# Patient Record
Sex: Female | Born: 1950 | Race: White | Hispanic: No
Health system: Southern US, Community
[De-identification: ages and names within clinical notes are randomized; demographics above are authoritative.]

---

## 2005-08-30 ENCOUNTER — Encounter (INDEPENDENT_AMBULATORY_CARE_PROVIDER_SITE_OTHER): Payer: Self-pay | Admitting: Specialist

## 2005-08-30 ENCOUNTER — Ambulatory Visit (HOSPITAL_COMMUNITY): Admission: RE | Admit: 2005-08-30 | Discharge: 2005-08-30 | Payer: Self-pay | Admitting: General Surgery

## 2006-09-19 ENCOUNTER — Emergency Department (HOSPITAL_COMMUNITY): Admission: EM | Admit: 2006-09-19 | Discharge: 2006-09-19 | Payer: Self-pay | Admitting: Emergency Medicine

## 2008-01-29 ENCOUNTER — Ambulatory Visit (HOSPITAL_COMMUNITY): Admission: EM | Admit: 2008-01-29 | Discharge: 2008-01-29 | Payer: Self-pay | Admitting: Emergency Medicine

## 2008-01-29 ENCOUNTER — Encounter (INDEPENDENT_AMBULATORY_CARE_PROVIDER_SITE_OTHER): Payer: Self-pay | Admitting: Obstetrics and Gynecology

## 2008-02-09 ENCOUNTER — Ambulatory Visit: Admission: RE | Admit: 2008-02-09 | Discharge: 2008-02-09 | Payer: Self-pay | Admitting: Gynecologic Oncology

## 2008-02-23 ENCOUNTER — Encounter: Payer: Self-pay | Admitting: Gynecologic Oncology

## 2008-02-23 ENCOUNTER — Inpatient Hospital Stay (HOSPITAL_COMMUNITY): Admission: RE | Admit: 2008-02-23 | Discharge: 2008-02-26 | Payer: Self-pay | Admitting: Obstetrics & Gynecology

## 2008-02-29 ENCOUNTER — Ambulatory Visit: Payer: Self-pay | Admitting: Oncology

## 2008-03-08 ENCOUNTER — Ambulatory Visit: Admission: RE | Admit: 2008-03-08 | Discharge: 2008-03-08 | Payer: Self-pay | Admitting: Gynecologic Oncology

## 2008-03-18 LAB — COMPREHENSIVE METABOLIC PANEL
ALT: 21 U/L (ref 0–35)
Albumin: 4.4 g/dL (ref 3.5–5.2)
Alkaline Phosphatase: 119 U/L — ABNORMAL HIGH (ref 39–117)
Glucose, Bld: 88 mg/dL (ref 70–99)
Potassium: 4.4 mEq/L (ref 3.5–5.3)
Sodium: 135 mEq/L (ref 135–145)
Total Bilirubin: 0.4 mg/dL (ref 0.3–1.2)
Total Protein: 7.3 g/dL (ref 6.0–8.3)

## 2008-03-18 LAB — CBC WITH DIFFERENTIAL/PLATELET
BASO%: 0.3 % (ref 0.0–2.0)
Eosinophils Absolute: 0.7 10*3/uL — ABNORMAL HIGH (ref 0.0–0.5)
LYMPH%: 15.1 % (ref 14.0–48.0)
MCHC: 34.5 g/dL (ref 32.0–36.0)
MCV: 93.8 fL (ref 81.0–101.0)
MONO#: 0.7 10*3/uL (ref 0.1–0.9)
MONO%: 9 % (ref 0.0–13.0)
NEUT#: 5.4 10*3/uL (ref 1.5–6.5)
RBC: 4.39 10*6/uL (ref 3.70–5.32)
RDW: 15.7 % — ABNORMAL HIGH (ref 11.3–14.5)
WBC: 8.1 10*3/uL (ref 3.9–10.0)

## 2008-03-21 ENCOUNTER — Encounter: Admission: RE | Admit: 2008-03-21 | Discharge: 2008-03-21 | Payer: Self-pay | Admitting: Oncology

## 2008-03-25 ENCOUNTER — Encounter: Admission: RE | Admit: 2008-03-25 | Discharge: 2008-03-25 | Payer: Self-pay | Admitting: Oncology

## 2008-04-06 LAB — CBC WITH DIFFERENTIAL/PLATELET
BASO%: 0.5 % (ref 0.0–2.0)
EOS%: 1.6 % (ref 0.0–7.0)
HCT: 39 % (ref 34.8–46.6)
LYMPH%: 20.4 % (ref 14.0–48.0)
MCH: 32.5 pg (ref 26.0–34.0)
MCHC: 34.7 g/dL (ref 32.0–36.0)
MCV: 93.8 fL (ref 81.0–101.0)
MONO#: 0.4 10*3/uL (ref 0.1–0.9)
MONO%: 5.5 % (ref 0.0–13.0)
NEUT%: 72 % (ref 39.6–76.8)
Platelets: 372 10*3/uL (ref 145–400)
RBC: 4.16 10*6/uL (ref 3.70–5.32)
WBC: 7.2 10*3/uL (ref 3.9–10.0)

## 2008-04-06 LAB — COMPREHENSIVE METABOLIC PANEL
ALT: 29 U/L (ref 0–35)
Alkaline Phosphatase: 111 U/L (ref 39–117)
CO2: 24 mEq/L (ref 19–32)
Creatinine, Ser: 0.59 mg/dL (ref 0.40–1.20)
Sodium: 138 mEq/L (ref 135–145)
Total Bilirubin: 0.3 mg/dL (ref 0.3–1.2)
Total Protein: 6.8 g/dL (ref 6.0–8.3)

## 2008-04-06 LAB — CA 125: CA 125: 10.5 U/mL (ref 0.0–30.2)

## 2008-04-15 ENCOUNTER — Ambulatory Visit: Payer: Self-pay | Admitting: Oncology

## 2008-04-15 LAB — CBC WITH DIFFERENTIAL/PLATELET
Basophils Absolute: 0 10*3/uL (ref 0.0–0.1)
Eosinophils Absolute: 0.1 10*3/uL (ref 0.0–0.5)
HCT: 39.6 % (ref 34.8–46.6)
HGB: 13.7 g/dL (ref 11.6–15.9)
MCH: 32.7 pg (ref 26.0–34.0)
MONO#: 1.1 10*3/uL — ABNORMAL HIGH (ref 0.1–0.9)
NEUT#: 6.4 10*3/uL (ref 1.5–6.5)
NEUT%: 65.2 % (ref 39.6–76.8)
lymph#: 2.2 10*3/uL (ref 0.9–3.3)

## 2008-05-02 LAB — COMPREHENSIVE METABOLIC PANEL
Albumin: 4.5 g/dL (ref 3.5–5.2)
Alkaline Phosphatase: 98 U/L (ref 39–117)
BUN: 16 mg/dL (ref 6–23)
CO2: 23 mEq/L (ref 19–32)
Glucose, Bld: 90 mg/dL (ref 70–99)
Total Bilirubin: 0.3 mg/dL (ref 0.3–1.2)

## 2008-05-02 LAB — CBC WITH DIFFERENTIAL/PLATELET
Basophils Absolute: 0 10*3/uL (ref 0.0–0.1)
EOS%: 4.7 % (ref 0.0–7.0)
Eosinophils Absolute: 0.2 10*3/uL (ref 0.0–0.5)
HCT: 37.6 % (ref 34.8–46.6)
HGB: 13 g/dL (ref 11.6–15.9)
MCH: 32.6 pg (ref 26.0–34.0)
MCV: 94.5 fL (ref 81.0–101.0)
MONO%: 14.4 % — ABNORMAL HIGH (ref 0.0–13.0)
NEUT#: 1.8 10*3/uL (ref 1.5–6.5)
NEUT%: 44.2 % (ref 39.6–76.8)
Platelets: 414 10*3/uL — ABNORMAL HIGH (ref 145–400)

## 2008-05-09 LAB — CBC WITH DIFFERENTIAL/PLATELET
Basophils Absolute: 0 10*3/uL (ref 0.0–0.1)
Eosinophils Absolute: 0 10*3/uL (ref 0.0–0.5)
HGB: 13.9 g/dL (ref 11.6–15.9)
MCV: 92.1 fL (ref 81.0–101.0)
MONO#: 0 10*3/uL — ABNORMAL LOW (ref 0.1–0.9)
MONO%: 0.5 % (ref 0.0–13.0)
NEUT#: 5.8 10*3/uL (ref 1.5–6.5)
Platelets: 367 10*3/uL (ref 145–400)
RBC: 4.35 10*6/uL (ref 3.70–5.32)
RDW: 14.6 % — ABNORMAL HIGH (ref 11.3–14.5)
WBC: 6.3 10*3/uL (ref 3.9–10.0)

## 2008-05-25 ENCOUNTER — Ambulatory Visit: Admission: RE | Admit: 2008-05-25 | Discharge: 2008-05-25 | Payer: Self-pay | Admitting: Gynecologic Oncology

## 2008-05-27 LAB — CBC WITH DIFFERENTIAL/PLATELET
Basophils Absolute: 0 10*3/uL (ref 0.0–0.1)
EOS%: 0.8 % (ref 0.0–7.0)
Eosinophils Absolute: 0.1 10*3/uL (ref 0.0–0.5)
HCT: 38.5 % (ref 34.8–46.6)
HGB: 13.3 g/dL (ref 11.6–15.9)
MCH: 33 pg (ref 26.0–34.0)
MCV: 95.5 fL (ref 81.0–101.0)
MONO%: 7.6 % (ref 0.0–13.0)
NEUT%: 71.6 % (ref 39.6–76.8)
lymph#: 1.4 10*3/uL (ref 0.9–3.3)

## 2008-05-27 LAB — COMPREHENSIVE METABOLIC PANEL
AST: 19 U/L (ref 0–37)
BUN: 12 mg/dL (ref 6–23)
Calcium: 9.9 mg/dL (ref 8.4–10.5)
Chloride: 102 mEq/L (ref 96–112)
Creatinine, Ser: 0.61 mg/dL (ref 0.40–1.20)
Glucose, Bld: 93 mg/dL (ref 70–99)

## 2008-05-27 LAB — CA 125: CA 125: 8.4 U/mL (ref 0.0–30.2)

## 2008-06-15 ENCOUNTER — Ambulatory Visit: Payer: Self-pay | Admitting: Oncology

## 2008-06-17 LAB — CBC WITH DIFFERENTIAL/PLATELET
BASO%: 0.5 % (ref 0.0–2.0)
LYMPH%: 27.9 % (ref 14.0–48.0)
MCHC: 34.8 g/dL (ref 32.0–36.0)
MONO#: 0.7 10*3/uL (ref 0.1–0.9)
MONO%: 13.1 % — ABNORMAL HIGH (ref 0.0–13.0)
Platelets: 342 10*3/uL (ref 145–400)
RBC: 3.73 10*6/uL (ref 3.70–5.32)
RDW: 17.4 % — ABNORMAL HIGH (ref 11.3–14.5)
WBC: 5.3 10*3/uL (ref 3.9–10.0)

## 2008-06-17 LAB — COMPREHENSIVE METABOLIC PANEL
ALT: 16 U/L (ref 0–35)
Alkaline Phosphatase: 103 U/L (ref 39–117)
CO2: 23 mEq/L (ref 19–32)
Potassium: 4.3 mEq/L (ref 3.5–5.3)
Sodium: 138 mEq/L (ref 135–145)
Total Bilirubin: 0.3 mg/dL (ref 0.3–1.2)
Total Protein: 6.5 g/dL (ref 6.0–8.3)

## 2008-07-08 LAB — CBC WITH DIFFERENTIAL/PLATELET
BASO%: 0.4 % (ref 0.0–2.0)
EOS%: 1.6 % (ref 0.0–7.0)
HCT: 35.1 % (ref 34.8–46.6)
LYMPH%: 26.4 % (ref 14.0–48.0)
MCH: 34.3 pg — ABNORMAL HIGH (ref 26.0–34.0)
MCHC: 34.8 g/dL (ref 32.0–36.0)
MCV: 98.3 fL (ref 81.0–101.0)
MONO%: 12.8 % (ref 0.0–13.0)
NEUT%: 58.8 % (ref 39.6–76.8)
Platelets: 228 10*3/uL (ref 145–400)

## 2008-07-08 LAB — COMPREHENSIVE METABOLIC PANEL
ALT: 16 U/L (ref 0–35)
AST: 14 U/L (ref 0–37)
Creatinine, Ser: 0.59 mg/dL (ref 0.40–1.20)
Total Bilirubin: 0.3 mg/dL (ref 0.3–1.2)

## 2008-08-01 ENCOUNTER — Ambulatory Visit: Payer: Self-pay | Admitting: Oncology

## 2008-08-01 LAB — CBC WITH DIFFERENTIAL/PLATELET
EOS%: 1.8 % (ref 0.0–7.0)
Eosinophils Absolute: 0.1 10*3/uL (ref 0.0–0.5)
LYMPH%: 45.7 % (ref 14.0–49.7)
MCH: 34.7 pg — ABNORMAL HIGH (ref 25.1–34.0)
MCHC: 34.8 g/dL (ref 31.5–36.0)
MCV: 99.9 fL (ref 79.5–101.0)
MONO%: 10 % (ref 0.0–14.0)
NEUT#: 1.5 10*3/uL (ref 1.5–6.5)
Platelets: 262 10*3/uL (ref 145–400)
RBC: 3.37 10*6/uL — ABNORMAL LOW (ref 3.70–5.45)
RDW: 17.4 % — ABNORMAL HIGH (ref 11.2–14.5)

## 2008-08-15 ENCOUNTER — Ambulatory Visit (HOSPITAL_COMMUNITY): Admission: RE | Admit: 2008-08-15 | Discharge: 2008-08-15 | Payer: Self-pay | Admitting: Oncology

## 2008-08-17 ENCOUNTER — Ambulatory Visit: Admission: RE | Admit: 2008-08-17 | Discharge: 2008-08-17 | Payer: Self-pay | Admitting: Gynecologic Oncology

## 2008-08-18 ENCOUNTER — Ambulatory Visit (HOSPITAL_COMMUNITY): Admission: RE | Admit: 2008-08-18 | Discharge: 2008-08-18 | Payer: Self-pay | Admitting: Gynecologic Oncology

## 2008-11-09 ENCOUNTER — Ambulatory Visit: Payer: Self-pay | Admitting: Oncology

## 2008-11-11 ENCOUNTER — Ambulatory Visit (HOSPITAL_COMMUNITY): Admission: RE | Admit: 2008-11-11 | Discharge: 2008-11-11 | Payer: Self-pay | Admitting: Oncology

## 2008-11-11 LAB — CBC WITH DIFFERENTIAL/PLATELET
BASO%: 0.6 % (ref 0.0–2.0)
HCT: 41.4 % (ref 34.8–46.6)
LYMPH%: 29.6 % (ref 14.0–49.7)
MCHC: 34.7 g/dL (ref 31.5–36.0)
MCV: 100.2 fL (ref 79.5–101.0)
MONO#: 0.5 10*3/uL (ref 0.1–0.9)
MONO%: 8.9 % (ref 0.0–14.0)
NEUT%: 58 % (ref 38.4–76.8)
Platelets: 371 10*3/uL (ref 145–400)
RBC: 4.14 10*6/uL (ref 3.70–5.45)

## 2008-11-11 LAB — COMPREHENSIVE METABOLIC PANEL
ALT: 18 U/L (ref 0–35)
AST: 16 U/L (ref 0–37)
Albumin: 4.8 g/dL (ref 3.5–5.2)
Alkaline Phosphatase: 100 U/L (ref 39–117)
Calcium: 10.4 mg/dL (ref 8.4–10.5)
Chloride: 106 mEq/L (ref 96–112)
Potassium: 4.6 mEq/L (ref 3.5–5.3)
Sodium: 141 mEq/L (ref 135–145)
Total Protein: 7.5 g/dL (ref 6.0–8.3)

## 2008-11-17 ENCOUNTER — Ambulatory Visit (HOSPITAL_COMMUNITY): Admission: RE | Admit: 2008-11-17 | Discharge: 2008-11-17 | Payer: Self-pay | Admitting: Oncology

## 2008-11-29 ENCOUNTER — Ambulatory Visit (HOSPITAL_COMMUNITY): Admission: RE | Admit: 2008-11-29 | Discharge: 2008-11-29 | Payer: Self-pay | Admitting: Oncology

## 2009-02-06 ENCOUNTER — Ambulatory Visit: Payer: Self-pay | Admitting: Oncology

## 2009-02-22 ENCOUNTER — Ambulatory Visit: Admission: RE | Admit: 2009-02-22 | Discharge: 2009-02-22 | Payer: Self-pay | Admitting: Gynecologic Oncology

## 2009-05-09 ENCOUNTER — Emergency Department (HOSPITAL_COMMUNITY): Admission: EM | Admit: 2009-05-09 | Discharge: 2009-05-09 | Payer: Self-pay | Admitting: Emergency Medicine

## 2009-05-17 ENCOUNTER — Ambulatory Visit: Payer: Self-pay | Admitting: Oncology

## 2009-05-19 LAB — CBC WITH DIFFERENTIAL/PLATELET
BASO%: 0.3 % (ref 0.0–2.0)
Basophils Absolute: 0 10*3/uL (ref 0.0–0.1)
EOS%: 2 % (ref 0.0–7.0)
HGB: 13.9 g/dL (ref 11.6–15.9)
MCH: 33.5 pg (ref 25.1–34.0)
MCHC: 34.3 g/dL (ref 31.5–36.0)
MCV: 97.7 fL (ref 79.5–101.0)
MONO%: 7.3 % (ref 0.0–14.0)
RBC: 4.16 10*6/uL (ref 3.70–5.45)
RDW: 14.1 % (ref 11.2–14.5)
lymph#: 1.3 10*3/uL (ref 0.9–3.3)

## 2009-05-19 LAB — COMPREHENSIVE METABOLIC PANEL
ALT: 12 U/L (ref 0–35)
AST: 13 U/L (ref 0–37)
Albumin: 4.6 g/dL (ref 3.5–5.2)
Alkaline Phosphatase: 98 U/L (ref 39–117)
Potassium: 4.5 mEq/L (ref 3.5–5.3)
Sodium: 139 mEq/L (ref 135–145)
Total Bilirubin: 0.3 mg/dL (ref 0.3–1.2)
Total Protein: 7.1 g/dL (ref 6.0–8.3)

## 2009-05-19 LAB — CA 125: CA 125: 9.1 U/mL (ref 0.0–30.2)

## 2009-05-22 ENCOUNTER — Encounter: Admission: RE | Admit: 2009-05-22 | Discharge: 2009-05-22 | Payer: Self-pay | Admitting: Oncology

## 2009-08-03 ENCOUNTER — Ambulatory Visit: Payer: Self-pay | Admitting: Oncology

## 2009-08-25 LAB — CA 125: CA 125: 9.5 U/mL (ref 0.0–30.2)

## 2009-10-28 IMAGING — CT CT CHEST W/ CM
2 of 3 series · 15 of 36 positions shown, 18 images · IV contrast (agent unspecified)
Comparison: None

CLINICAL DATA: Fallopian tube cancer

CT CHEST WITH CONTRAST
TECHNIQUE: Multidetector CT imaging of the chest was performed
following the standard protocol during bolus administration of
intravenous contrast.
Contrast: 80 ml of omni 300

[Series 2: chest with st · axial · 0.74mm/px · z∈[-176,+118]mm · 12 of 71 slices shown, 15 images]
[im 6/71  mediastinal]
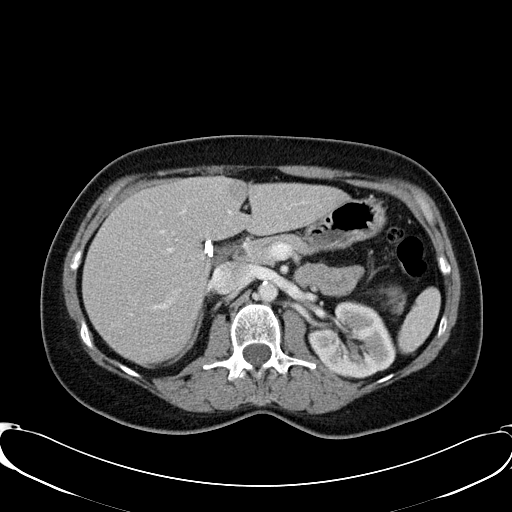
[im 6/71  lung]
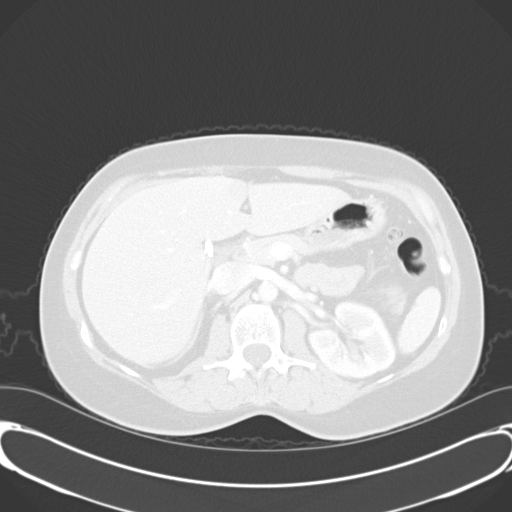
[im 11/71  lung]
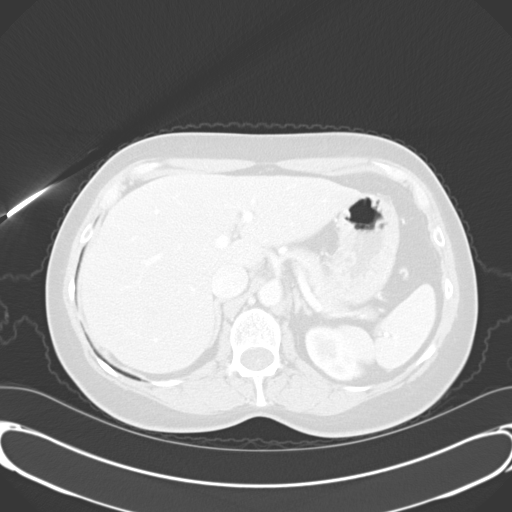
[im 16/71  lung]
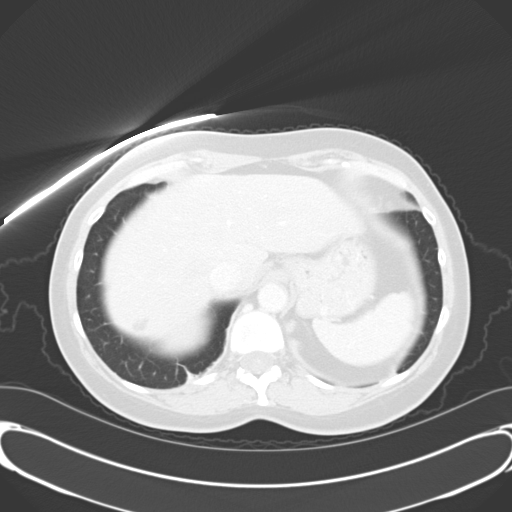
[im 21/71  lung]
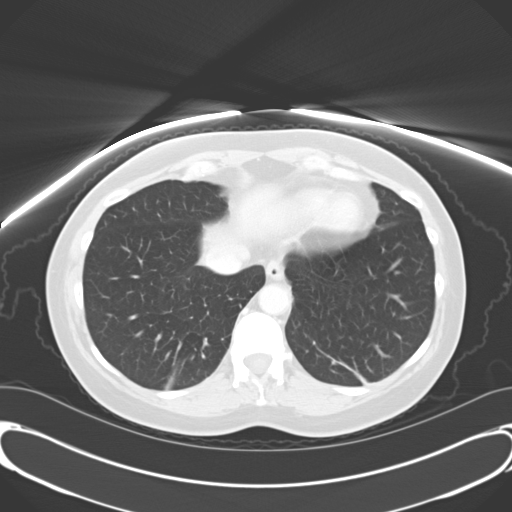
[im 26/71  mediastinal]
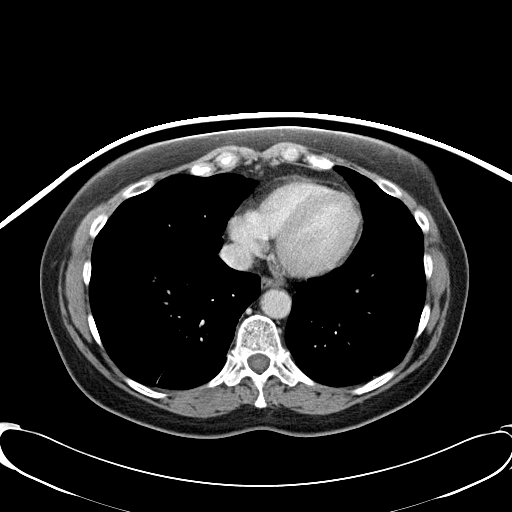
[im 26/71  lung]
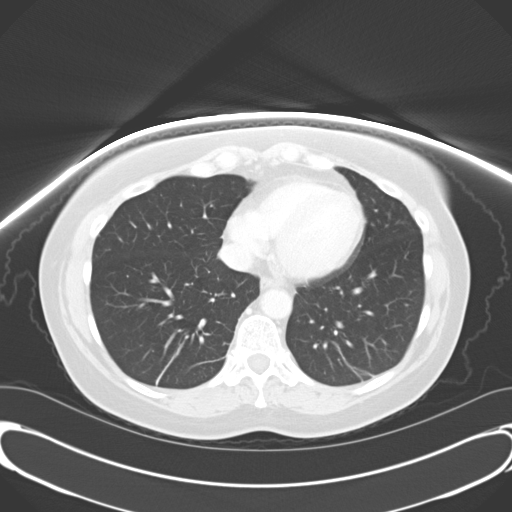
[im 32/71  lung]
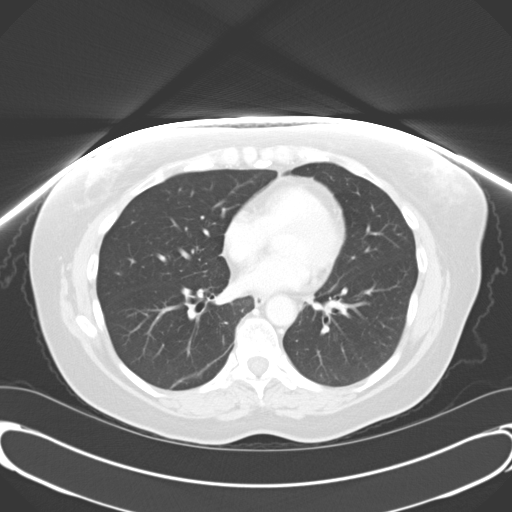
[im 39/71  lung]
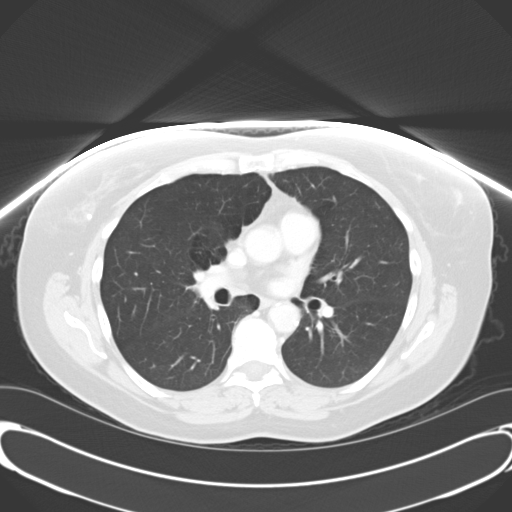
[im 45/71  lung]
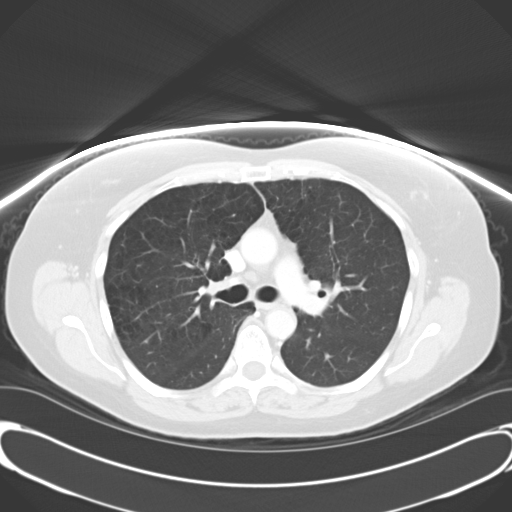
[im 50/71  mediastinal]
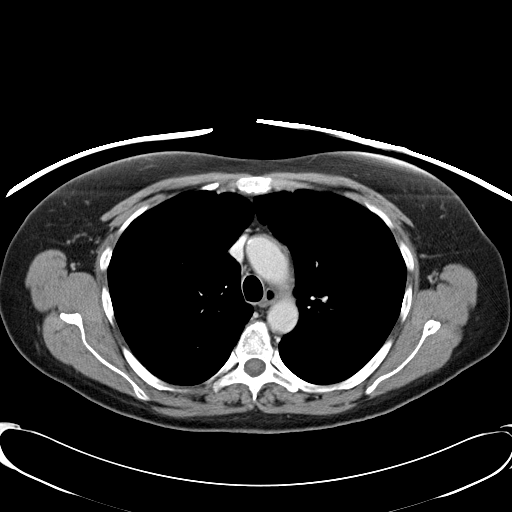
[im 50/71  lung]
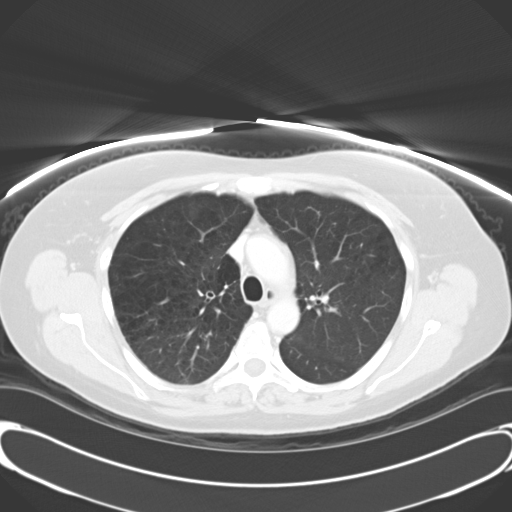
[im 55/71  lung]
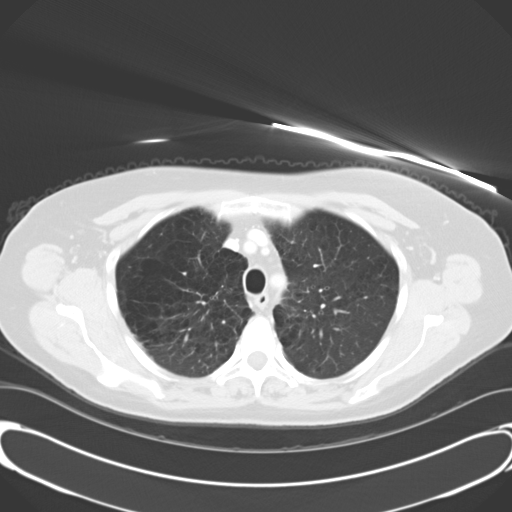
[im 60/71  lung]
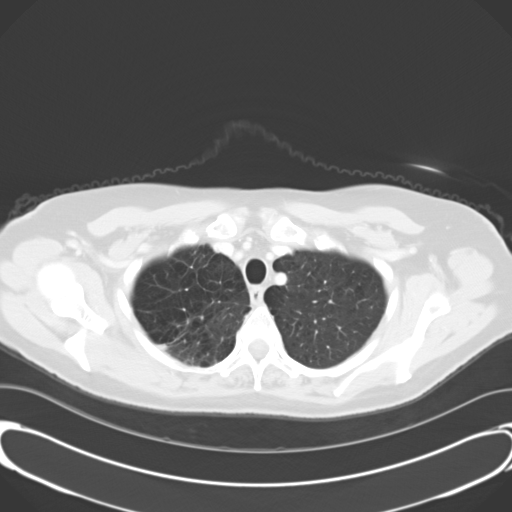
[im 65/71  lung]
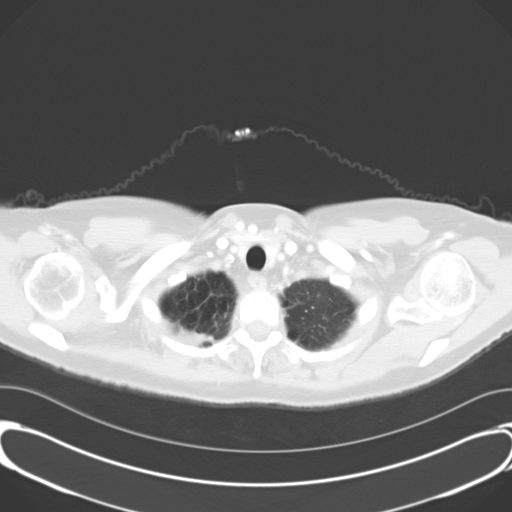

[Series 602: <mpr thick range> · coronal · 0.74mm/px · 3 of 62 slices shown]
[im 13/62  lung]
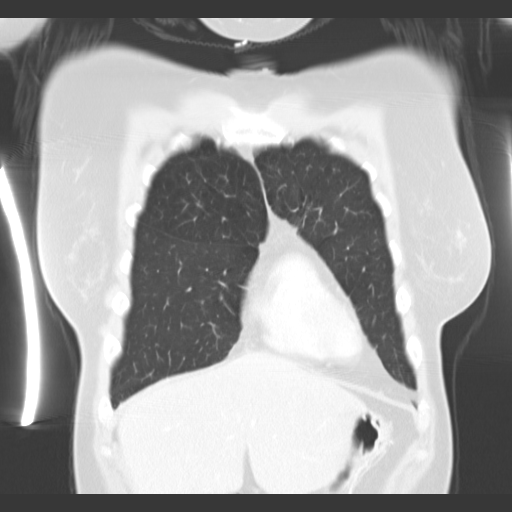
[im 25/62  lung]
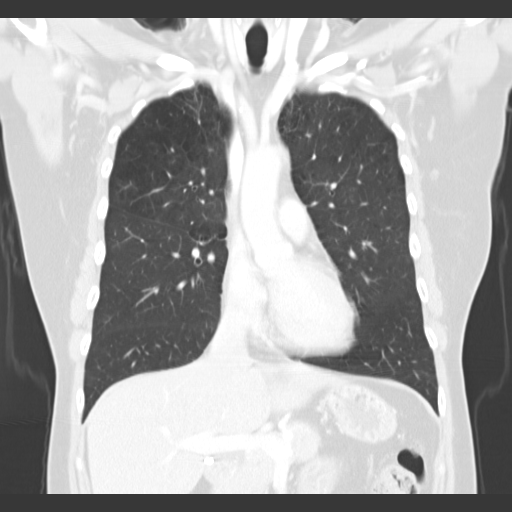
[im 37/62  lung]
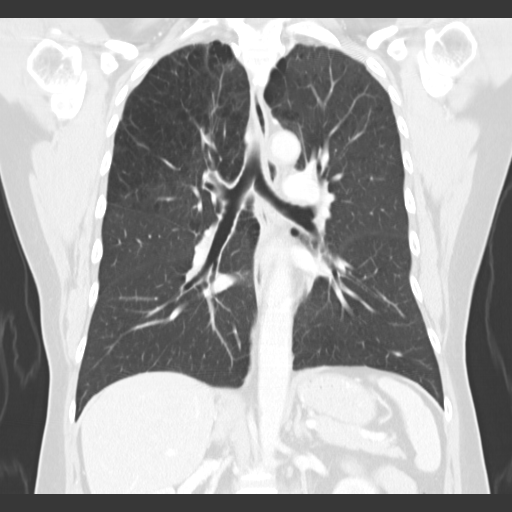

[15 of 36 positions shown; findings below may reference images not displayed]

FINDINGS: No enlarged axillary or supraclavicular lymph nodes.

There is no enlarged mediastinal or hilar lymph nodes.

No pericardial or pleural effusion is noted.

The lungs are emphysematous.

There is scar like densities within both lung bases.

Scar like density is also noted with the lingular portion of the
left within the right upper lobe there is an asymmetric area of
soft tissue density measuring 1.4 x 0.9 cm, image 46.

Within the dome of the liver there is a hypodense structure
measuring 1.3 cm, image number 56.  This is unchanged from prior
exam.

Review of the visualized osseous structures shows no lytic or
sclerotic lesions.
IMPRESSION: 1.  Right apical density most likely represents an area of
scarring.  Malignancy is considered less favored but not excluded.
In this patient who has a history of tumor I would recommend a PET
CT to assess for hypermetabolism, in order to better determine the
likelihood of tumor.
2.  Bibasilar scarring.

## 2009-11-22 ENCOUNTER — Ambulatory Visit: Payer: Self-pay | Admitting: Oncology

## 2009-12-19 LAB — CBC WITH DIFFERENTIAL/PLATELET
Basophils Absolute: 0 10*3/uL (ref 0.0–0.1)
EOS%: 3 % (ref 0.0–7.0)
Eosinophils Absolute: 0.2 10*3/uL (ref 0.0–0.5)
HCT: 42 % (ref 34.8–46.6)
HGB: 14.4 g/dL (ref 11.6–15.9)
MCH: 33 pg (ref 25.1–34.0)
MCV: 95.8 fL (ref 79.5–101.0)
MONO%: 7.2 % (ref 0.0–14.0)
NEUT%: 60.1 % (ref 38.4–76.8)

## 2009-12-19 LAB — COMPREHENSIVE METABOLIC PANEL
AST: 17 U/L (ref 0–37)
Alkaline Phosphatase: 121 U/L — ABNORMAL HIGH (ref 39–117)
BUN: 11 mg/dL (ref 6–23)
Calcium: 10.5 mg/dL (ref 8.4–10.5)
Chloride: 105 mEq/L (ref 96–112)
Creatinine, Ser: 0.72 mg/dL (ref 0.40–1.20)
Glucose, Bld: 94 mg/dL (ref 70–99)

## 2010-03-08 ENCOUNTER — Ambulatory Visit: Payer: Self-pay | Admitting: Oncology

## 2010-04-25 ENCOUNTER — Ambulatory Visit
Admission: RE | Admit: 2010-04-25 | Discharge: 2010-04-25 | Payer: Self-pay | Source: Home / Self Care | Admitting: Gynecologic Oncology

## 2010-07-01 ENCOUNTER — Encounter: Payer: Self-pay | Admitting: Oncology

## 2010-07-02 NOTE — Consult Note (Signed)
°

## 2010-08-21 LAB — CA 125: CA 125: 9.8 U/mL (ref 0.0–30.2)

## 2010-09-17 LAB — GLUCOSE, CAPILLARY: Glucose-Capillary: 93 mg/dL (ref 70–99)

## 2010-09-20 ENCOUNTER — Other Ambulatory Visit: Payer: Self-pay | Admitting: Oncology

## 2010-09-20 DIAGNOSIS — Z1231 Encounter for screening mammogram for malignant neoplasm of breast: Secondary | ICD-10-CM

## 2010-10-04 ENCOUNTER — Ambulatory Visit: Payer: Self-pay

## 2010-10-19 ENCOUNTER — Ambulatory Visit: Payer: Self-pay

## 2010-10-23 NOTE — Consult Note (Signed)
[name], [name]                ACCOUNT NO.:  [id number]   MEDICAL RECORD NO.:  [id number]          PATIENT TYPE:  OUT   LOCATION:  GYN                          FACILITY:  [facility]   PHYSICIAN:  Paola A. Duard Brady, MD    DATE OF BIRTH:  Apr 03, 1951   DATE OF CONSULTATION:  02/09/2008  DATE OF DISCHARGE:                                 CONSULTATION   REFERRING PHYSICIAN:  Janine Limbo, MD   The patient is seen today in consultation at the request of Dr.  Stefano Gaul.  [name] is a 60 year old gravida 4, para 3-0-1-3, who  presented to the Emergency Room at [facility] on January 29, 2008.  She  presented with acute abdominal pain that occurred fairly suddenly on her  way to work.  It was severe.  She started having cold sweats, shaking,  and was taken to the emergency room.  In the emergency room, she  underwent a CT scan of the abdomen and pelvis.  It was a noncontrast CT.  Within the pelvis was a complex right adnexal mass which was difficult  to accurately define given the absence of oral and IV contrast.  It was  estimated to be 11.3 x 6.5 x 12.9 cm.  It demonstrated thick septations  with areas of calcification and high density that could reflect  hemorrhage.  There was no left adnexal mass.  The uterus was surgically  absent.  There was some free pelvic fluid.  There was no significant  mention of any lymphadenopathy.  The appendix appeared fairly normal in  appearance.  The patient subsequently was taken to the operating room  and underwent diagnostic laparoscopy, laparoscopic right salpingo-  oophorectomy, pelvic washings and laparoscopic biopsies of serosal  lesions from the bowel.  Operative findings included 10 cm ruptured  cystic mass in the right adnexa.  The fluid was gray in appearance with  lobules of adipose tissue in the fluid.  The anterior and posterior cul-  de-sacs were clear.  There were 2 small yellow-appearing lesions on the  transverse colon that measured  approximately 3 mm in size.  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  The biopsies that  Dr. Stefano Gaul obtained were negative.  The washings were negative.  She  was finally staged as a clinical stage IC.  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  Tumor cells were negative for CK20 and  positive for CK7 consistent with a GYN primary.  She has done well from  that surgery and comes in today to discuss further treatment options and  recommendations for her ovarian carcinoma.  In hindsight, she states she  really did not have any symptoms prior to that day at work.  She denies  any chest pain, shortness of breath, nausea, vomiting, fevers, chills,  headaches or visual changes.  She denies any significant abdominal pain,  significant change in bowel or bladder habits, early satiety, bloating.  PAST MEDICAL HISTORY:  Anxiety.   MEDICATIONS:  Xanax p.r.n.   ALLERGIES:  Sensitive to CODEINE which causes headaches, nausea and  vomiting.   PAST SURGICAL HISTORY:  Vaginal hysterectomy at the age of 60 for  dysmenorrhea and menometrorrhagia, tubal ligation, laparoscopic  cholecystectomy.   SOCIAL HISTORY:  She is single but is involved in a relationship.  She  smokes a pack per day.  She has done this for 30 years.  She denies use  of alcohol.  She works at [employer] for [employer].   PAST OBSTETRICAL HISTORY:  She is a gravida 4, para 3.  She has had 3  spontaneous vaginal deliveries and 1 elective pregnancy termination.   FAMILY HISTORY:  Her mother had breast cancer in her late 65s.  She was  diagnosed after a fall, was taken to the emergency room and noted to  have bony metastases which were biopsied and consistent with breast  cancer.  Her mother unfortunately passed away in 10/01/2007.  Her  maternal  grandfather had breast cancer.  She has a sister with  endometrial cancer who died at the age of 66.  She was treated at [facility].  She has a brother who had a breast mass system biopsied and  was benign.   HEALTH MAINTENANCE:  Her last mammogram was 10 years ago.  She has never  had a colonoscopy.   PHYSICAL EXAMINATION:  VITAL SIGNS:  Height 5 feet 4 inches, weight 146  pounds, blood pressure 138/70, pulse 88.  GENERAL:  Well-nourished, well-developed female in no acute distress.  NECK:  Supple.  There is no lymphadenopathy, no thyromegaly.  LUNGS:  Clear to auscultation bilaterally.  CARDIOVASCULAR:  Regular rate and rhythm.  ABDOMEN:  Notable for a laparoscopy skin incisions.  Abdomen is soft,  nontender, nondistended, and there are no palpable masses or  hepatosplenomegaly.  Exam is limited due to some slight voluntary  guarding.  Groins were negative for adenopathy.  EXTREMITIES:  There is  no edema.  PELVIC:  External genitalia is within normal limits.  Bimanual  examination reveals no masses or nodularity.  RECTAL:  Confirms pelvic exam.   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  The patient also has a significant family  history for genetic mutation in that her mother had metastatic breast  cancer.  Her maternal grandfather had breast cancer.  There has been no  genetic testing done in the family.  I discussed with her that at the  very least, she needs to proceed with chemotherapy as she has stage IC  disease based on capsule rupture.  However, I would recommend a formal  staging.  I met with her, her son, and her significant other.  We would  like to proceed with exploratory laparotomy, left salpingo-oophorectomy,  omentectomy, appropriate biopsies and lymphadenectomy.  Due to the fact  that this is a mucinous carcinoma arising in the right ovary, I would  also proceed with an appendectomy.  It would be ideal if  the patient  could have a colonoscopy prior to this to rule out a gastrointestinal  primary as a source of this ovarian lesion, though with cytokeratin  staining, it most likely is a primary gynecologic lesion, though some  gastrointestinal tumors can have this immunohistochemical profile.  The  risks of surgery including bleeding, infection, injury to surrounding  organs were discussed with the patient and her family.  She does  understand that she will require chemotherapy postoperatively, but the  purpose of the surgery is to adequately stage her and best determine  what agents and what route of chemotherapy from which she would benefit  as well as for prognostic information.  Their questions were elicited  and answered to their satisfaction.  She is  tentatively scheduled for surgery on February 23, 2008, so she  understands that she needs to speak to her primary care physician, Dr.  Theressa Millard, soon to be set up with gastroenterology.  She will  contact us if we can be of assistance with that.      Paola A. Duard Brady, MD  Electronically Signed     PAG/MEDQ  D:  02/09/2008  T:  02/09/2008  Job:  [id number]   cc:   Telford Nab, R.N.  [address]  [place], [place] [address]   Janine Limbo, M.D.  Fax: [phone]   Theressa Millard, M.D.  Fax: [phone]

## 2010-10-23 NOTE — Consult Note (Signed)
[name], [name]                ACCOUNT NO.:  [id number]   MEDICAL RECORD NO.:  [id number]          PATIENT TYPE:  OUT   LOCATION:  GYN                          FACILITY:  [facility]   PHYSICIAN:  Paola A. Duard Brady, MD    DATE OF BIRTH:  27-Feb-1951   DATE OF CONSULTATION:  03/08/2008  DATE OF DISCHARGE:                                 CONSULTATION   .   HISTORY OF PRESENT ILLNESS:  The patient is a very pleasant 60 year old  who was initially referred to [name] by Dr. Stefano Gaul.  She had presented to  the emergency room with acute onset abdominal pain.  She subsequently  underwent diagnostic laparoscopy, laparoscopic right salpingo-  oophorectomy, pelvic washings and biopsies of the serosal lesion on the  bowel.  Operative findings included a 10 cm ruptured cystic mass in the  right adnexa.  Final pathology was consistent with a grade 1 mucinous  adenocarcinoma stage IC based on the rupture.  After lengthy discussion,  the patient opted for complete comprehensive staging.  On February 23, 2008 she underwent exploratory laparotomy, left salpingo-oophorectomy,  bilateral pelvic and periaortic lymph node dissection, omentectomy,  appendectomy and staging biopsies.  Operative findings included some  mild adhesive disease of the rectosigmoid colon to the left adnexa and  adhesions of the epiploica to the vaginal cuff.  There is no evidence of  extraovarian disease.  All biopsies, all lymph nodes, omentum and  appendix were all negative.  Her final stage, therefore, stage IC, grade  1 mucinous adenocarcinoma of the ovary.  She did undergo a preoperative  colonoscopy, which was similarly negative.  She has an appointment Dr.  Darrold Span on October 9 and also has an appointment for  chemotherapy  teaching on October 8.  She is overall doing fairly well.  She does  occasionally takes pain medication.  Her sleep is somewhat disrupted,  she is not sure if she is waking up because she is having some  discomfort or because she is worried about starting chemotherapy. Ambien  does not work for her and Benadryl makes her quite jittery.  She,  overall, feels really well and is very pleased with her care.   PHYSICAL EXAMINATION:  VITAL SIGNS:  Weight 143 pounds, which is down 3  pounds, blood pressure 138/68.  GENERAL:  Well-nourished, alert female in no acute distress.  ABDOMEN:  Shows well-healed vertical midline incision.  There is a small  separation, less than a centimeter, which is quite superficial where the  prior laparoscopic infraumbilical incision met the new vertical midline  incision.  The area is otherwise clean.  There is no exudate.  There is  no purulence.  Abdomen is soft and nontender.   ASSESSMENT:  A 60 year old with stage IC mucinous adenocarcinoma, grade  1, has had comprehensive staging.  Did discuss with the patient having 3  versus 6 cycles of chemotherapy.  Based on [facility]  (GOG) data, it does appear that there is a trend to improve survival  benefit to patients who have 6 cycles, she would  be amenable  to having 6 cycles of chemotherapy provided that she  tolerates it well.  She will follow up with Dr. Darrold Span.  1. She will continue local wound care and she will return to see [name]      during her chemotherapy.      Paola A. Duard Brady, MD  Electronically Signed     PAG/MEDQ  D:  03/08/2008  T:  03/08/2008  Job:  [id number]   cc:   Theressa Millard, M.D.  Fax: [phone]   Lennis P. Darrold Span, M.D.  Fax: [phone]   Sigmund I. Patsi Sears, M.D.  Fax: [phone]   Charlsie Quest, MD   Janine Limbo, M.D.  Fax: [phone]   Telford Nab, R.N.  [address]  [place], [place] [address]

## 2010-10-23 NOTE — Op Note (Signed)
NAME:  [name], [name]                ACCOUNT NO.:  [id number]   MEDICAL RECORD NO.:  [id number]          PATIENT TYPE:  INP   LOCATION:  1530                         FACILITY:  [facility]   PHYSICIAN:  Paola A. Duard Brady, MD    DATE OF BIRTH:  07-24-1950   DATE OF PROCEDURE:  02/23/2008  DATE OF DISCHARGE:                               OPERATIVE REPORT   PREOPERATIVE DIAGNOSIS:  Stage Ic ovarian carcinoma.   POSTOPERATIVE DIAGNOSIS:  Stage Ic ovarian carcinoma.   PROCEDURE:  Exploratory laparotomy, left salpingo-oophorectomy,  bilateral pelvic and periaortic lymph node dissection, omentectomy,  appendectomy and staging biopsies.   SURGEON:  Paola A. Duard Brady, MD, and Tamela Oddi, M.D.   ASSISTANT:  Telford Nab, R.N.   ANESTHESIA:  General.   ANESTHESIOLOGIST:  Quentin Cornwall. Council Mechanic, M.D.   SPECIMENS:  Washings, bilateral pelvic and periaortic lymph nodes,  omentum, appendix, bilateral pelvic, paracolic gutter biopsies.  Diaphragm Pap smear.  Anterior-posterior peritoneal biopsies.   DISPOSITION:  Specimens to pathology.   BLOOD LOSS:  50 mL.   INTRAVENOUS FLUIDS:  2000 mL.   URINE OUTPUT:  175 mL.   COMPLICATIONS:  None.   OPERATIVE FINDINGS:  Included some mild adhesive disease of the  rectosigmoid colon to the left adnexa.  There was adhesions of the  epiploica to the vaginal cuff.  There was no evidence of extra-ovarian  disease.   The patient was taken to the operating room, placed in supine position  where she was identified as self.  General anesthesia was then induced.  She was then placed in the dorsal lithotomy position with all  appropriate precautions and SCDs.  The abdomen was prepped in usual  fashion.  The perineum and vagina were prepped in usual fashion.  Foley  catheter was inserted into the bladder under sterile conditions.  The  patient was then draped.  Time out was performed to confirm the patient,  the surgeons, antibiotic status, and procedures.  A  vertical midline  infraumbilical incision was made with a knife and carried down to the  underlying fascia using Bovie cautery.  The fascia was identified,  scored in the midline, and the fascial incision was extended.  The  rectus bellies were dissected off the overlying fascia.  The peritoneal  cavity was tented, as the peritoneum was grasped and entered sharply,  the peritoneal incision was extended superiorly and inferiorly.  At this  point with palpation, there was no obvious evidence of any extra-ovarian  disease.  All peritoneal surfaces were smooth.  There was no adenopathy  palpable.  At this point, we realized we needed to extend the incision  to be able to perform the periaortic lymphadenectomy.  We then continued  in a curvilinear fashion around the right aspect of the umbilicus.  This  was done with a knife and the fascial incisions extended using cautery.  Colic omentectomy was performed in the usual fashion.  The infracolic  peritoneal adhesions to the omentum was taken down with Bovie cautery.  Successive pedicles were created and clamped with Kelly clamps,  transected and suture ligated with  Vicryl suture.  There was adhesive  disease of the left side of the omentum to the anterior abdominal wall.  These were taken down with Bovie cautery.  We completed the partial  omentectomy.  All pedicles were noted to be hemostatic.  When the lesser  sac was entered, palpation of the pancreas was unremarkable.  After the  omentectomy was completed, self-retaining Bookwalter retractor was  placed onto the abdomen.  The small bowel was packed in with appropriate  precautions.  The adhesive disease of the rectosigmoid colon to the left  adnexa and the epiploica to the vaginal cuff were taken down using Bovie  cautery.  We entered the retroperitoneal space on the patient's left  side.  The round ligament was transected.  The ureter was identified.  A  window was made between the IP and  the ureter.  The IP was clamped x2,  transected and suture ligated.  The left tube and ovary were handed off  for specimen.  At this point, left and right pelvic bladder adhesion,  anterior peritoneum and posterior cul-de-sac biopsies were performed by  grasping the peritoneum and transecting it with Metzenbaum scissors.  The biopsy sites were noted to be hemostatic.   We then entered the paravesical space on the patient's right side.  We  extended the peritoneal incision around the IP which had been coagulated  at the time of her laparoscopic right salpingo-oophorectomy.  The ureter  was identified.  We then came down back approximately 4 cm from the  cauterized edge of the IP and took the right IP.  It was set secondarily  suture ligated.  We opened the patient's perirectal space.  Using  monopolar cautery, the external iliac nodes were taken in the usual  fashion from the level of the mid common to the circumflex iliac.  We  then continued down to the internal nodal basin.  The obturator nerve  was identified, and the nodal bundle superior to the obturator nerve was  taken using pinpoint cautery.  The ureter was noted be peristalsing, and  the genitofemoral and obturator nerves were left intact.  Our attention  was then drawn to performing the right periaortic lymph node dissection.  The small bowel was repacked in the appropriate fashion.  Peritoneum  overlying the aorta was opened with Bovie cautery.  The ureter was  identified and separated out to the psoas and elevated.  The nodal  bundle overlying the aorta and the vena cava was taken using pinpoint  cautery and Metzenbaum scissors as indicated.  This was performed to the  level of the duodenum where the bundle ran out.  Similarly on the  patient's left side, the ureter was identified and elevated.  The psoas  muscles was visualized.  The IMA was seen.  The nodal bundle  encompassing the left pelvic nodes was taken in the usual  fashion to the  level of the IMA.  There was palpably no adenopathy superior to either  of our bundles.  The bundles were noted to be hemostatic.  Attention was  then drawn to the patient's left pelvic lymph nodes.  We opened the  paravesical space on the patient's left side.  The perirectal space was  opened, and the ureter was taken medially.  The nodal bundle overlying  the external iliac artery and vein was taken using pinpoint cautery.  The genitofemoral nerves were visualized and spared.  The obturator  nerve was identified, and the nodal bundle superior  to the obturator  nerve was similarly taken as it was on the right side.  The pedicles  were noted to be hemostatic.  The ureter was noted to be peristalsing  freely and well medial to the area of dissection.  The genitofemoral and  obturator nerves were intact.  The small-bowel retractors were then  removed.  All pedicles were noted to be hemostatic.  Bilateral paracolic  gutter biopsies were then taken in the usual fashion.  A Pap smear of  the diaphragm was obtained.  The abdomen and pelvis were copiously  irrigated.  All pedicle sites were reinspected and noted be hemostatic.  Our attention was then drawn to the cecum.  The appendix was grasped  using a Babcock.  The mesoappendix was separated from the appendiceal  wall.  It was clamped with a snap, transected and suture ligated with 2-  0 Vicryl.  A crush mark was then made at the base of the appendix.  The  crush mark was then milked up, and 2 separate 2-0 Vicryl sutures were  made in the area of the crush on the cecal side.  The appendix was then  amputated using a knife.  This dirty instrument was then used to touch  the appendix, and it was cauterized using cautery.  The pedicle and  appendiceal stump were then visualized and noted to be hemostatic, and  sutures were intact.  All laparotomy sponges removed from the patient's  abdomen.  The fascia was closed using a running  #1 PDS.  The skin  was made hemostatic with pinpoint cautery and was irrigated.  The skin  was closed using skin clips.  The patient tolerated the procedure well  and was taken recovery room in stable condition.  All instrument,  needle, Ray-Tec and laparotomy counts were correct x2.      Paola A. Duard Brady, MD  Electronically Signed     PAG/MEDQ  D:  02/23/2008  T:  02/24/2008  Job:  [id number]   cc:   Theressa Millard, M.D.  Fax: [phone]   Roseanna Rainbow, M.D.  Fax: [phone]   Telford Nab, R.N.  [address]  [place], [place] [address]

## 2010-10-23 NOTE — Consult Note (Signed)
NAMEIRIDESSA, [name]                ACCOUNT NO.:  [id number]   MEDICAL RECORD NO.:  [id number]          PATIENT TYPE:  OUT   LOCATION:  GYN                          FACILITY:  [facility]   PHYSICIAN:  Paola A. Duard Brady, MD    DATE OF BIRTH:  10-10-1950   DATE OF CONSULTATION:  DATE OF DISCHARGE:                                 CONSULTATION   Ms. [name] is a very pleasant 60 year old with history of a stage 1C  mucinous adenocarcinoma diagnosed in 2009.  She underwent acute surgery  consisting of diagnostic laparoscopy, laparoscopic right salpingo-  oophorectomy, washings and biopsies for acute abdominal pain.  Operative  findings included a 10 cm ruptured cystic mass in the right adnexa.  In  September of 2009, she underwent exploratory laparotomy, left salpingo-  oophorectomy, bilateral pelvic and parotid lymph node dissection,  omentectomy, appendectomy and staging biopsies.  Because of the  preoperative rupture, she was dispositioned to 6 cycles of paclitaxel  and carboplatin which she has undergone and tolerated fairly well under  the care of Dr. Darrold Span.  She did have a post-treatment CT scan on August 15, 2008.  It reveals some bibasilar linear scarring in the lung bases.  There are several benign-appearing hepatic cysts.  Slightly inferior,  there is a subcapsular 12 mm lesion which does not have a simple fluid  attenuation.  This region was not imaged on the comparison CT.  The  stomach, small bowel and colon are normal.  There is no lymphadenopathy,  retroperitoneal lymph nodes.  There is no evidence for any residual  carcinoma recurrent in the pelvis.  There is a low density lesion in the  left pelvis consistent with a postoperative seroma or lymphocyst.  She  is overall doing fairly well.  She states she tolerated the chemotherapy  well.  She does have some small amount of numbness and tingling in her  fingers or toes, but her energy level is quite good.  She denies any  nausea or  vomiting.  She states that in Dr. Precious Reel notes it mentioned  that she has gotten Neulasta.  The patient states she does not remember  getting Neulasta, and I have asked her to clarify that issue with Dr.  Darrold Span when she sees her back.  She is otherwise without complaint.  She denies any abdominal pain, nausea, vomiting, fevers, chills,  headaches or visual change.  She denies any significant change in her  bowel or bladder habits.   PHYSICAL EXAMINATION:  VITAL SIGNS:  Weight 151 pounds.  GENERAL:  Well-nourished, well-developed female in no acute distress.  NECK:  Supple.  There is no lymphadenopathy, no thyromegaly.  LUNGS:  Clear to auscultation bilaterally.  CARDIOVASCULAR:  Regular rate and rhythm.  ABDOMEN:  A well-healed vertical midline incision.  There is no evidence  of any incisional hernias.  Abdomen is soft, nontender, nondistended.  There are no palpable masses or hepatosplenomegaly.  Groins are negative  for adenopathy.  EXTREMITIES:  There is no edema.  PELVIC:  External genitalia is within normal limits.  Bimanual  examination reveals  no masses or nodularity.  RECTAL:  Confirms.   ASSESSMENT:  57. 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      She has completed 6 cycles of chemotherapy and clinically has no      evidence of recurrent disease.  Her CA-125 is also normal at 7 on      July 08, 2008.  Her prestaging CA-125 had been 26, which was      also within normal limits.  Based on this one subcapsular lesion on      the CT scan that was not imaged on prior studies, I believe it to      be most prudent to proceed with an MRI of the abdomen.  The patient      and her husband are willing to proceed with this, and they would      like to know what this area might be.  The patient is scheduled for      it tonight, and we will contact her      with the results.  2. She already has a free standing appointment to see Dr. Darrold Span in       June and will return to see [name] in September of 2010.  We will      contact the patient with the results of her MRI.      Paola A. Duard Brady, MD  Electronically Signed     PAG/MEDQ  D:  08/17/2008  T:  08/17/2008  Job:  [id number]   cc:   Janine Limbo, M.D.  Fax: [phone]   Theressa Millard, M.D.  Fax: [phone]   Lennis P. Darrold Span, M.D.  Fax: [phone]   Telford Nab, R.N.  [address]  [place], [place] [address]

## 2010-10-23 NOTE — Discharge Summary (Signed)
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  Please  see the dictated history and physical per Dr. Rockney Ghee.   HOSPITAL COURSE:  The patient was admitted and underwent an exploratory  laparotomy and also left salpingo-oophorectomy, bilateral pelvic and  periaortic lymph node dissection, omentectomy, appendectomy and staging  biopsies.  Please see the dictated operative summary for further  details.  On postoperative day #1, her hematocrit was 37.5, and her diet  was advanced.  The remainder of her hospital course was uneventful.  She  was discharged to home on postoperative day #3   DISCHARGE DIAGNOSIS:  Stage IC ovarian carcinoma.   PROCEDURES:  Exploratory laparotomy, left salpingo-oophorectomy,  bilateral pelvic and periaortic lymph node dissection, omentectomy,  appendectomy and staging biopsies.   CONDITION ON DISCHARGE:  Good.   DIET:  Regular.   ACTIVITY:  Progressive activity, pelvic rest.   MEDICATIONS:  Please see the medication reconciliation form.   DISPOSITION:  The patient is to follow up with Dr. Duard Brady on September  29th at 2 p.m. and she was to follow up on September 23rd at 10 a.m. for  staple removal.      Roseanna Rainbow, M.D.  Electronically Signed     LAJ/MEDQ  D:  03/11/2008  T:  03/13/2008  Job:  [id number]   cc:   Telford Nab, R.N.  [address]  [place], [place] [address]   Janine Limbo, M.D.  Fax: [phone]   Theressa Millard, M.D.  Fax: [phone]

## 2010-10-23 NOTE — Consult Note (Signed)
[name], PETTUS                ACCOUNT NO.:  [id number]   MEDICAL RECORD NO.:  [id number]          PATIENT TYPE:  OUT   LOCATION:  GYN                          FACILITY:  [facility]   PHYSICIAN:  Paola A. Duard Brady, MD    DATE OF BIRTH:  1951-02-21   DATE OF CONSULTATION:  05/25/2008  DATE OF DISCHARGE:                                 CONSULTATION   The patient is a very pleasant 60 year old who underwent a diagnostic  laparoscopy, laparoscopic right salpingo-oophorectomy, pelvic washings  and biopsies for acute abdominal pain.  Operative findings include a 10  cm ruptured cystic mass in the right adnexa.  Final pathology was  consistent with a mucinous adenocarcinoma 1C based on the rupture.  In  September of 2009 she underwent exploratory laparotomy, left salpingo-  oophorectomy, bilateral pelvic and periaortic lymph node dissection,  omentectomy, appendectomy and staging biopsies.  Final pathology was  consistent with disease confined to the ovary, however, because of this  ruptured status of her lesion she was dispositioned to 6 cycles of  paclitaxel and carboplatin.  She has undergone three cycles and is due  for her fourth cycle this coming Monday.  She is overall tolerating her  chemotherapy quite well.  She states that her legs ache for about a week  to week and half after her infusion.  She has got minimal grade 1  neuropathy in her fingertip, she is able to perform all her usual  activity.  She denies any change in bowel or bladder habits.  She did  after her last cycle have 1 day of slight abdominal pain after her  infusion but then that resolve quickly.   PHYSICAL EXAMINATION:  Weight 144 pounds which is stable, well-  nourished, well-developed female in no acute distress.  NECK:  Supple, there is no lymphadenopathy, no thyromegaly.  LUNGS:  Clear to auscultation bilaterally.  CARDIOVASCULAR:  Regular rate and rhythm.  ABDOMEN:  Shows a well-healed vertical midline incision.   Abdomen is  soft, nontender, nondistended.  There are no palpable masses or  hepatosplenomegaly.  Groins are negative for adenopathy.  EXTREMITIES:  There is no edema.  PELVIC:  External genitalia is within normal limits.  Bimanual  examination reveals no masses or nodularity.  RECTAL:  Confirms.   ASSESSMENT:  A 57-year with stage IC mucinous adenocarcinoma of the  ovary.  She has had complete staging and preimaging as well as screening  for colon cancer.  She is doing well with her adjuvant chemotherapy and  is scheduled for cycle number 4 this coming Monday.  For her bone pain  she was encouraged take some ibuprofen starting on Friday in  anticipation of her infusion, alternatively if that does not help and  her bone pain is severe they  could always consider short course of steroids postinfusion for  symptomatic relief.  We will plan on seeing her after her third cycle of  chemotherapy.  We will ask Dr. Darrold Span to be kind enough to get a CT  scan post completion of her six cycles of chemotherapy for baseline,  then we will begin her regular surveillance.      Paola A. Duard Brady, MD  Electronically Signed     PAG/MEDQ  D:  05/25/2008  T:  05/26/2008  Job:  [id number]   cc:   Telford Nab, R.N.  [address]  [place], [place] [address]   Theressa Millard, M.D.  Fax: [phone]   Lennis P. Darrold Span, M.D.  Fax: [phone]   Janine Limbo, M.D.  Fax: [phone]

## 2010-10-23 NOTE — Op Note (Signed)
[name], [name]                ACCOUNT NO.:  [id number]   MEDICAL RECORD NO.:  [id number]          PATIENT TYPE:  EMS   LOCATION:  ED                           FACILITY:  [facility]   PHYSICIAN:  Janine Limbo, M.D.DATE OF BIRTH:  02-13-1951   DATE OF PROCEDURE:  01/29/2008  DATE OF DISCHARGE:                               OPERATIVE REPORT   PREOPERATIVE DIAGNOSES:  1. 12.9 x 11.3 cm complex right ovarian cyst.  2. Severe right lower quadrant pain.  3. Increased abdominal fluid on CT scan.   POSTOPERATIVE DIAGNOSES:  1. Ruptured right complex ovarian cyst measuring 12.9 cm x 11.3 cm.  2. Right lower quadrant pain.  3. Increased abdominal fluid on CT scan.  4. Torsion of the right ovarian cyst.   PROCEDURES:  1. Diagnostic laparoscopy.  2. Laparoscopic right salpingo-oophorectomy.  3. Pelvic washings.  4. Laparoscopic biopsy of serosal lesion from the bowel.   SURGEON:  Leonard Schwartz, M.D.   FIRST ASSISTANT:  None.   ANESTHETIC:  General.   DISPOSITION:  Ms. [name] is a 60 year old female, para 3-0-1-3, who  presents with the above-mentioned diagnoses.  I am the physician on call  for the emergency department today and I was called to see the patient  on an emergency consultation basis.  The patient understands the  indications for her surgical procedure and she accepts the risks of, but  not limited to, anesthetic complications, bleeding, infection, and  possible damage to the surrounding organs.  She understands that there  is the possibility that we may find cancer and that a laparotomy may be  required.  She also understands that there is the possibility  that we  may not be able to do her procedure through the laparoscope and that  laparotomy may be required.  She also understands that we may not be  able to appreciate a malignancy and that additional surgery may be  required in the future.  The patient elects to keep her left tube and  ovary unless there  are signs of obvious cancer or unless there are signs  of pathology to the left tube and ovary.   FINDINGS:  The patient was found to have a 10 cm ruptured cystic mass in  the right adnexa.  The fluid was gray in appearance and there appeared  to be lobules of adipose tissue in the fluid.  The anterior cul-de-sac  and the posterior cul-de-sac appeared clear.  There was no obvious  evidence  of malignancy present.  The left tube and ovary appeared  normal except for some scarring to the left pelvic sidewall.  The liver  and the diaphragm appeared normal except for some scar tissue and this  is believed to be secondary to her prior cholecystectomy.  The appendix  appeared normal.  We were able to visualize a large portion of the bowel  and the bowel appeared completely normal except for 2 small yellow  appearing lesions on the transverse colon that measured 0.3 cm in  maximum size.  These lesions were on the serosa of the colon  and we were  able to easily remove the largest of the lesions and this was sent to  Pathology.  Care was taken not to damage even the serosa of the colon.  The portion of the omentum that we were able to visualize appeared  normal.  The uterus and cervix were surgically absent.  There were mild  adhesions between the colon and the left pelvic sidewall.   PROCEDURE:  The patient was taken to the operating room where a general  anesthetic was given.  The patient's abdomen, perineum, and vagina were  prepped with multiple layers of Betadine.  A Foley catheter was placed  in the bladder.  A padded sponge stick was placed in the vagina.  The  patient was sterilely draped.  The subumbilical area was injected with a  diluted solution of half percent Marcaine and epinephrine.  A  subumbilical incision was made and carried sharply through the  subcutaneous tissue, the fascia, and the anterior peritoneum.  The  Hassan cannula was sutured into place using 0-Vicryl.  The  laparoscope  was placed through the [facility] cannula and a pneumoperitoneum was  obtained.  The pelvis was visualized with findings as mentioned above.  The first thing we recognized is the large amount of ascites fluid  present.  The fluid was then removed and sent to Pathology.  We then  irrigated the pelvis carefully.  Two lower abdominal incisions were made  and two 5 mm trocars were placed into the abdomen under direct  visualization.  The skin was prepped with half percent Marcaine prior to  placing the trocars.  We were then able to elevate the ovarian cyst into  the mid abdomen and we saw that there was indeed torsion of the right  ovarian cyst.  We carefully inspected the right pelvic sidewall and the  ureter was noted to not be in our surgical areas.  We then placed an  Endoloop suture around the ovarian cyst, although this was with some  difficulty.  The suture was then tied securely on the right  infundibulopelvic ligament.  We attempted to place a second Endoloop  suture, but this was not successful.  We then cauterized the right  infundibulopelvic ligament taking care not to damage any of the vital  surrounding tissues.  The infundibulopelvic ligament was cut.  At this  point, we then placed a second Endoloop suture on the infundibulopelvic  ligament and tied it securely, 0-Vicryl was used.  We removed the large  cystic mass from the umbilical port.  This was made easier because of  the fact that it had ruptured even prior to our surgery.  We then  irrigated the pelvis once again.  The irrigation fluid was aspirated.  The pelvis was carefully inspected.  There was noted to be a yellow  appearing lesion on the colon and this was removed without difficulty.  We irrigated the pelvis once again and hemostasis was noted to be  adequate.  We felt that we were ready to then terminate our procedure.  The trocars were removed under direct visualization.  The Hassan cannula  was  removed.  The subumbilical fascia was closed using figure-of-eight  sutures of  0-Vicryl.  The skin was closed from all incisions using 3-0  Monocryl.  The padded sponge stick was removed from the vagina.  The  Foley catheter was removed from the bladder.  Sponge, needle, and  instrument counts were correct on 2 occasions.  The estimated  blood loss  was between 25 and 30 mL.  The patient did tolerate her procedure well.  The patient was awakened from her anesthetic without difficulty.  She  was taken to the recovery room in stable condition.  The right ovarian  cyst and the biopsy from the serosa of the bowel were sent to Pathology.  The washings were sent to cytology.   Follow-up instructions:  The patient will take Darvocet 1 or 2 tablets  every 4 hours as needed for severe pain.  She will take Motrin 800 mg  every 8 hours as needed for mild-to-moderate pain.  She was given Zofran  and she will take 4 mg every 4 to 6 hours as needed for nausea.  She  will return to see Dr. Stefano Gaul in 1 to 2 weeks for follow-up  examination.  She will call for questions or concerns.  She was given a  copy of postoperative instructions.  These include no heavy lifting for  1 to 2 weeks, no intercourse for 1 to 2 weeks, and call for elevated  temperatures or severe problems.      Janine Limbo, M.D.  Electronically Signed     AVS/MEDQ  D:  01/29/2008  T:  01/29/2008  Job:  [id number]   cc:   Vonzell Schlatter. Patsi Sears, M.D.  Fax: [phone]   Charlsie Quest, MD

## 2010-10-23 NOTE — Consult Note (Signed)
[name], [name]                ACCOUNT NO.:  [id number]   MEDICAL RECORD NO.:  [id number]          PATIENT TYPE:  EMS   LOCATION:  ED                           [facility]ACILITY:  [facility]   PHYSICIAN:  Janine Limbo, M.D.DATE OF BIRTH:  [birth year]   DATE OF CONSULTATION:  01/29/2008  DATE OF DISCHARGE:                                 CONSULTATION   HISTORY OF PRESENT ILLNESS:  Ms. [name] is a 60 year old female para 3-0-1-  3 who presented to the emergency department at [facility]  today.  I was the GYN physician on call for the emergency department and  was called to see Ms. [name].  The patient reports that she had the sudden  onset of severe right lower quadrant pain that was unbearable.  That  occurred this morning.  She had never had this pain before.  She was in  her usual state of good health.  The patient did have a hysterectomy  performed at age 19.  She is not followed regularly by a GYN physician.  She does not recall the last time she had a Pap smear nor does she  recall the last time she has had a mammogram.  She denies a past history  of sexually-transmitted diseases.  She did have a cholecystectomy in the  past.  The patient denies problems with bowel movements and she denies  rectal bleeding.  She denies menopausal symptoms.   OBSTETRICAL HISTORY:  The patient has had 3 term vaginal deliveries and  1 elective pregnancy termination.   PAST MEDICAL HISTORY:  The patient has a history of anxiety and she  takes Xanax on an as-needed basis.  She had a broken finger that is now  well healed.  She had a hysterectomy as mentioned above.   DRUG ALLERGIES:  THE PATIENT SAYS THAT SHE IS ALLERGIC TO CODEINE AND IT  CAUSES NAUSEA AND VOMITING.   SOCIAL HISTORY:  The patient is married.  She is trying to stop smoking  cigarettes.  She denies alcohol use and other drug use.   REVIEW OF SYSTEMS:  Please see history of present illness.   FAMILY HISTORY:  The patient says that  her mother developed breast  cancer when she was in her late 89s and early 51s.  Of interest is her  grandfather, who had breast cancer.  She says that she has a sister with  either a uterine or perhaps a cervical cancer.   PHYSICAL EXAMINATION:  The patient is afebrile and her vital signs are  stable.  Her pulse was 106.  HEENT:  Within normal limits except for an obviously uncomfortable  patient.  CHEST:  Clear.  HEART:  Regular rate and rhythm.  ABDOMEN:  Soft and her bowel sounds are positive.  No rebound was  present.  There was some guarding and tenderness.  EXTREMITIES:  Her extremities are grossly normal.  NEUROLOGIC EXAM:  Grossly normal.  PELVIC EXAM:  External genitalia is normal.  Vagina is normal.  Cervix  is absent.  The uterus is absent.  Adnexa:  There is a  large tender mass  in the right lower quadrant.  Rectovaginal exam confirms.   LABORATORY VALUES:  Hemoccult is negative.  Hemoglobin is 15.6,  hematocrit is 46.5%.  White blood cell count is 12,000.  Platelet count  is 519,000.  Chemistries are within normal limits except that her  glucose was 105 (IV fluid).  Her urinalysis was negative.  CT scan of  the pelvis was negative for a kidney stone.  The patient did have an  11.3 cm x 12.9 cm complex right cystic mass.  There was an increased  amount of abdominal fluid noted.   ASSESSMENT:  1. A 12.9 x 11.3 cm complex cystic mass in the right adnexa.  2. Severe right lower quadrant pain.  3. Increased white blood cell count.  4. Increased peritoneal fluid on computerized tomography scan.   PLAN:  We discussed her findings and we discussed her treatment options.  The risk and benefits of each of those options were reviewed.  The  patient elected to proceed with diagnostic laparoscopy and, if possible,  a laparoscopic right salpingo-oophorectomy.  She understands that we may  not be able to perform her procedure through the laparoscope and that  laparotomy may be  required.  She also understands that there is a  possibility a malignancy may be discovered, in which case, she may  require a right salpingo-oophorectomy, a left salpingo-oophorectomy,  omentectomy, selective biopsies, and possible bowel surgery.  She also  understands that we may not know from our procedure whether or not this  is a malignancy and that additional surgery maybe be required in the  future.  We discussed the options of left salpingo-oophorectomy even if  benign findings are discovered.  We have discussed the benefits and the  risks of total removal of all ovarian tissue.  The patient discussed all  of these options with her family, and she decided to keep her left ovary  unless there were obvious signs of malignancy and unless there were  obvious signs of pathology on the left ovary or tube.      Janine Limbo, M.D.  Electronically Signed     AVS/MEDQ  D:  01/29/2008  T:  01/29/2008  Job:  [id number]   cc:   [name] [facility] at [facility]

## 2010-10-26 NOTE — Op Note (Signed)
[name], [name]                ACCOUNT NO.:  [id number]   MEDICAL RECORD NO.:  [id number]          PATIENT TYPE:  AMB   LOCATION:  SDS                          FACILITY:  [facility]   PHYSICIAN:  Cherylynn Ridges, M.D.    DATE OF BIRTH:  [birth year]   DATE OF PROCEDURE:  08/30/2005  DATE OF DISCHARGE:                                 OPERATIVE REPORT   PREOP DIAGNOSIS:  Symptomatic cholelithiasis.   POSTOP DIAGNOSIS:  Symptomatic cholelithiasis.   PROCEDURE:  Laparoscopic cholecystectomy with intraoperative cholangiogram.   SURGEON:  Dr. Lindie Spruce.   ANESTHESIA:  General endotracheal.   ESTIMATED BLOOD LOSS:  Less than 10 ml.   COMPLICATIONS:  None.   CONDITION:  Stable.   INDICATIONS FOR OPERATION:  The patient is a 60 year old with recurrent  right upper quadrant epigastric pain, who now comes in for laparoscopic  cholecystectomy.   FINDINGS:  The patient had obvious adhesions from chronic cholecystitis.  Cholangiogram was normal with no ductal defects, dilatation or obstruction  of flow.   OPERATION:  The patient was taken to the operating room, placed on table in  supine position.  After an adequate general anesthetic was administered she  was prepped and draped in usual sterile manner exposing midline and right  upper quadrant.   A supraumbilical curvilinear incision was made using 11 blade taken down to  the midline fascia.  We grasped the fascia with Kocher clamps and incised in  between them into the fascia exposing the preperitoneal space.  We bluntly  dissected down to the peritoneal cavity then we placed a pursestring suture  of 0 Vicryl into the fascial opening.  We then passed the Hasson cannula  into the peritoneal cavity and secured it in place with pursestring suture.  Once this was done, we insufflated carbon dioxide gas up to maximal pressure  of 15 mmHg.   Once we had insufflated the abdomen, two right costal margin 5-mm cannulas  and a subxiphoid 11/12 mm  cannula passed under direct vision.  We then  placed the patient in steep reverse Trendelenburg left-side was tilted down  and the dissection begun.   There were omental adhesions to the body and the infundibulum of the  gallbladder which was taken down bluntly with the [facility] and  electrocautery.  Once we had the infundibulum freed, we dissected out the  peritoneum overlying the triangle of Calot and hepatic duodenal triangle  isolating both the cystic duct and the cystic artery.  The cystic artery was  doubly clipped proximally and distally and transected.  A clip along the  gallbladder side was placed on the cystic duct and then we made  cholecystodochotomy through which a Cook catheter which had been passed to  the anterior abdominal wall was cannulated.  We did a cholangiogram showed  good flow into the duodenum, no ductal dilatation.  No filling defects and  good proximal flow.  We removed the cannula and then distally clipped the  cystic duct x3 and then transected it.  During the process of waiting for x-  ray to get  there, we dissected out the gallbladder from its bed mostly and  we completed that once a cholangiogram was done and we transected the duct.  Once the gallbladder was free without any entrance into the gallbladder  wall, we brought it out through the supraumbilical fascia with minimal  difficulty.  We then closed off the supraumbilical fascia using the  pursestring suture which was in place.  We injected Marcaine in all sites  and closed the skin using running subcuticular stitch of 4-0 Vicryl.  0.25%  Marcaine with epi was used, a total of 16 ml.  All needle counts, sponge  counts and instrument counts were correct.  Sterile dressings were applied.      Cherylynn Ridges, M.D.  Electronically Signed     JOW/MEDQ  D:  08/30/2005  T:  09/02/2005  Job:  [id number]

## 2011-03-11 LAB — HEMATOCRIT: HCT: 37.6

## 2011-03-11 LAB — CBC
Hemoglobin: 12.6
MCHC: 33.7
MCV: 96.2
RDW: 15.3

## 2011-03-11 LAB — TYPE AND SCREEN
ABO/RH(D): O POS
Antibody Screen: NEGATIVE

## 2011-03-11 LAB — BASIC METABOLIC PANEL
CO2: 26
Calcium: 8.2 — ABNORMAL LOW
Chloride: 106
Creatinine, Ser: 0.63
Glucose, Bld: 117 — ABNORMAL HIGH

## 2011-03-13 ENCOUNTER — Other Ambulatory Visit: Payer: Self-pay | Admitting: Oncology

## 2011-03-13 ENCOUNTER — Encounter (HOSPITAL_BASED_OUTPATIENT_CLINIC_OR_DEPARTMENT_OTHER): Payer: Self-pay | Admitting: Oncology

## 2011-03-13 DIAGNOSIS — C569 Malignant neoplasm of unspecified ovary: Secondary | ICD-10-CM

## 2011-03-13 LAB — CBC WITH DIFFERENTIAL/PLATELET
Eosinophils Absolute: 0.1 10*3/uL (ref 0.0–0.5)
HCT: 43.4 % (ref 34.8–46.6)
LYMPH%: 17.7 % (ref 14.0–49.7)
MCV: 94.8 fL (ref 79.5–101.0)
MONO#: 0.6 10*3/uL (ref 0.1–0.9)
MONO%: 6 % (ref 0.0–14.0)
NEUT#: 7.7 10*3/uL — ABNORMAL HIGH (ref 1.5–6.5)
NEUT%: 74.7 % (ref 38.4–76.8)
Platelets: 399 10*3/uL (ref 145–400)
RBC: 4.58 10*6/uL (ref 3.70–5.45)

## 2011-03-13 LAB — COMPREHENSIVE METABOLIC PANEL
ALT: 20
ALT: 17 U/L (ref 0–35)
AST: 16
BUN: 18 mg/dL (ref 6–23)
CO2: 24 mEq/L (ref 19–32)
Calcium: 9.6
Calcium: 10 mg/dL (ref 8.4–10.5)
Chloride: 104 mEq/L (ref 96–112)
Creatinine, Ser: 0.64
Creatinine, Ser: 0.75 mg/dL (ref 0.50–1.10)
GFR calc Af Amer: >60
Glucose, Bld: 91 mg/dL (ref 70–99)
Sodium: 141
Total Bilirubin: 0.3 mg/dL (ref 0.3–1.2)
Total Protein: 6.7

## 2011-03-13 LAB — CBC
MCHC: 33.9
Platelets: 456 — ABNORMAL HIGH
RDW: 15.5

## 2011-03-13 LAB — DIFFERENTIAL
Eosinophils Absolute: 0.2
Eosinophils Relative: 3
Lymphocytes Relative: 31
Lymphs Abs: 2.7
Monocytes Relative: 8
Neutrophils Relative %: 57

## 2011-03-13 LAB — CA 125: CA 125: 11.3 U/mL (ref 0.0–30.2)

## 2013-07-19 ENCOUNTER — Telehealth: Payer: Self-pay | Admitting: Gynecologic Oncology

## 2013-07-19 NOTE — Telephone Encounter (Signed)
Error
